# Patient Record
Sex: Male | Born: 2000 | Race: White | Hispanic: No | Marital: Single
Health system: Midwestern US, Community
[De-identification: ages and names within clinical notes are randomized; demographics above are authoritative.]

## PROBLEM LIST (undated history)

## (undated) DIAGNOSIS — J301 Allergic rhinitis due to pollen: Secondary | ICD-10-CM

## (undated) DIAGNOSIS — S62609A Fracture of unspecified phalanx of unspecified finger, initial encounter for closed fracture: Secondary | ICD-10-CM

## (undated) DIAGNOSIS — J302 Other seasonal allergic rhinitis: Secondary | ICD-10-CM

## (undated) HISTORY — DX: Fracture of unspecified phalanx of unspecified finger, initial encounter for closed fracture: S62.609A

## (undated) HISTORY — DX: Allergic rhinitis due to pollen: J30.1

---
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
## 2022-02-07 ENCOUNTER — Inpatient Hospital Stay
Admit: 2022-02-07 | Discharge: 2022-02-07 | Disposition: A | Discharge status: Home / Self Care | Payer: PRIVATE HEALTH INSURANCE

## 2022-02-07 DIAGNOSIS — K921 Melena: Secondary | ICD-10-CM

## 2022-02-07 NOTE — Discharge Instructions (Addendum)
Eat a few servings of vegetables every day, drink lots of water, avoid processed food.   Stretch and meditate daily, workout at least 3 times a week, rest and think positively.

## 2022-02-07 NOTE — ED Triage Notes (Signed)
Patient ambulated to room with complaint of black stool for past 1.5 weeks and states today he had red blood in stool and on tissue. Denies pain, denies constipation or diarrhea.

## 2022-02-07 NOTE — ED Provider Notes (Signed)
[facility]  Urgent Care Encounter      CHIEF COMPLAINT       Chief Complaint   Patient presents with    Other     Blood in stool and on tissue, black stool       Nurses Notes reviewed and I agree except as noted in the HPI.  HISTORY OFPRESENT ILLNESS   Edward Charles is a 21 y.o.  The history is provided by the patient. No language interpreter was used.   Rectal Bleeding  Quality:  Black and tarry and bright red (not tarry)  Amount:  Moderate  Duration:  2 weeks  Timing:  Intermittent  Chronicity:  New  Context: constipation, diarrhea and spontaneously    Context: not anal fissures, not anal penetration, not defecation, not foreign body, not hemorrhoids, not rectal injury and not rectal pain    Similar prior episodes: no    Relieved by:  Nothing  Worsened by:  Defecation and wiping  Ineffective treatments:  None tried  Associated symptoms: no abdominal pain, no dizziness, no epistaxis, no fever, no hematemesis, no light-headedness, no loss of consciousness, no recent illness and no vomiting    Risk factors: no anticoagulant use, no hx of colorectal cancer, no hx of colorectal surgery, no hx of IBD, no liver disease, no NSAID use and no steroid use      REVIEW OF SYSTEMS     Review of Systems   Constitutional:  Negative for activity change, appetite change, chills, diaphoresis, fatigue and fever.   HENT:  Negative for nosebleeds.    Respiratory:  Negative for apnea, cough, choking, chest tightness, shortness of breath, wheezing and stridor.    Cardiovascular:  Negative for chest pain, palpitations and leg swelling.   Gastrointestinal:  Positive for anal bleeding, constipation, diarrhea and hematochezia. Negative for abdominal distention, abdominal pain, blood in stool, hematemesis, nausea, rectal pain and vomiting.   Neurological:  Negative for dizziness, loss of consciousness and light-headedness.     PAST MEDICAL HISTORY   History reviewed. No pertinent past medical history.    SURGICAL  HISTORY     Patient  has no past surgical history on file.    CURRENT MEDICATIONS       Discharge Medication List as of 02/07/2022  3:53 PM        CONTINUE these medications which have NOT CHANGED    Details   cetirizine (ZYRTEC) 10 MG tablet Take 1 tablet by mouth dailyHistorical Med             ALLERGIES     Patient is has No Known Allergies.    FAMILY HISTORY     Patient's family history is not on file.    SOCIAL HISTORY     Patient  reports that he has never smoked. He has never used smokeless tobacco. He reports that he does not currently use alcohol. He reports that he does not use drugs.    PHYSICAL EXAM     ED TRIAGE VITALS  BP: 118/73, Temp: 98.1 F (36.7 C), Pulse: 76, Respirations: 16, SpO2: 98 %  Physical Exam  Vitals and nursing note reviewed.   Constitutional:       General: He is awake. He is not in acute distress.     Appearance: Normal appearance. He is well-developed and well-groomed. He is not ill-appearing, toxic-appearing or diaphoretic.   HENT:      Head: Normocephalic and atraumatic.  Right Ear: External ear normal.      Left Ear: External ear normal.   Eyes:      Extraocular Movements: Extraocular movements intact.      Conjunctiva/sclera: Conjunctivae normal.   Pulmonary:      Effort: Pulmonary effort is normal.   Musculoskeletal:         General: Normal range of motion.      Cervical back: Normal range of motion.   Skin:     General: Skin is warm.   Neurological:      General: No focal deficit present.      Mental Status: He is alert and oriented to person, place, and time.   Psychiatric:         Mood and Affect: Mood normal.         Behavior: Behavior normal. Behavior is cooperative.         Thought Content: Thought content normal.         Judgment: Judgment normal.       DIAGNOSTIC RESULTS   Labs:No results found for this visit on 02/07/22.    IMAGING:  No orders to display     URGENT CARE COURSE:     Vitals:    02/07/22 1520   BP: 118/73   Pulse: 76   Resp: 16   Temp: 98.1 F (36.7  C)   TempSrc: Temporal   SpO2: 98%   Weight: 180 lb (81.6 kg)   Height: 5\' 6"  (1.676 m)       Medications - No data to display  PROCEDURES:  None  FINAL IMPRESSION      1. Hematochezia        DISPOSITION/PLAN   Decision To Discharge    I estimate there is LOW risk for ACUTE APPENDICITIS, BOWEL OBSTRUCTION, CHOLECYSTITIS, DIVERTICULITIS, INCARCERATED HERNIA, PANCREATITIS, PELVIC INFLAMMATORY DISEASE, PERFORATED BOWEL or ULCER, ECTOPIC PREGNANCY, or TUBO-OVARIAN ABSCESS, thus I consider the discharge disposition reasonable. Also, there is no evidence or peritonitis, sepsis, or toxicity. The patient and/or family and I have discussed the diagnosis and risks, and we agree with discharging home to follow-up with their primary doctor. We also discussed returning to the Emergency Department immediately if new or worsening symptoms occur. We have discussed the symptoms which are most concerning (e.g., severe bleeding, fever, changing or worsening pain, vomiting) that necessitate immediate return.        PATIENT REFERRED TO:  , MD  [address]  [place]  [phone]    Schedule an appointment as soon as possible for a visit       [facility]  [address]  [address]  [place]  [phone]  Call today  As needed    DISCHARGE MEDICATIONS:  Discharge Medication List as of 02/07/2022  3:53 PM        Discharge Medication List as of 02/07/2022  3:53 PM          02/09/2022, APRN - CNP          Irven Shelling, APRN - CNP  02/07/22 [phone]

## 2022-02-12 ENCOUNTER — Ambulatory Visit: Admit: 2022-02-12 | Discharge: 2022-02-12 | Payer: PRIVATE HEALTH INSURANCE | Attending: Nurse Practitioner

## 2022-02-12 ENCOUNTER — Inpatient Hospital Stay: Payer: PRIVATE HEALTH INSURANCE

## 2022-02-12 DIAGNOSIS — Z8719 Personal history of other diseases of the digestive system: Secondary | ICD-10-CM

## 2022-02-12 LAB — CBC
Hematocrit: 45.4 % (ref 42.0–52.0)
Hemoglobin: 14.4 gm/dl (ref 14.0–18.0)
MCH: 28.3 pg (ref 26.0–33.0)
MCHC: 31.7 gm/dl — ABNORMAL LOW (ref 32.2–35.5)
MCV: 89.2 fL (ref 80.0–94.0)
MPV: 10.4 fL (ref 9.4–12.4)
Platelets: 224 10*3/uL (ref 130–400)
RBC: 5.09 10*6/uL (ref 4.70–6.10)
RDW-CV: 13.9 % (ref 11.5–14.5)
RDW-SD: 45.2 fL — ABNORMAL HIGH (ref 35.0–45.0)
WBC: 4.1 10*3/uL — ABNORMAL LOW (ref 4.8–10.8)

## 2022-02-12 MED ORDER — POLYETHYLENE GLYCOL 3350 17 GM/SCOOP PO POWD
17 GM/SCOOP | ORAL | 0 refills | Status: DC
Start: 2022-02-12 — End: 2022-03-17

## 2022-02-12 MED ORDER — PANTOPRAZOLE SODIUM 40 MG PO TBEC
40 MG | ORAL_TABLET | Freq: Every day | ORAL | 5 refills | Status: AC
Start: 2022-02-12 — End: ?

## 2022-02-12 MED ORDER — BISACODYL 5 MG PO TBEC
5 MG | ORAL_TABLET | ORAL | 0 refills | Status: AC
Start: 2022-02-12 — End: 2022-03-17

## 2022-02-12 NOTE — Progress Notes (Unsigned)
Chief Complaint:   Chief Complaint   Patient presents with    New Patient     Referred by [facility] for blood in the stool. Pt states he first noticed blood in his stool about 2 weeks ago.  Pt states he has had melena. Pt states he has seen blood in his stool and on the tissue when he wipes after a BM.  Pt states he has loose stools at times. Pt states he has experienced rectal discomfort, achy. Pt states he has acid reflux at times depending on what he eats.  Pt denies any other Gi issues at this time.        History of present Illness: Edward Charles is a 21 y.o. male     Past Medical History:  has no past medical history on file.    Past Surgical History: History reviewed. No pertinent surgical history.    Social History:   Jacoby Tartaglia  reports that he has never smoked. He has never used smokeless tobacco. He  reports current alcohol use. He  reports no history of drug use.     Family History: family history includes Breast Cancer in his maternal grandmother and mother; Diabetes in his mother; No Known Problems in his father.    Review of Systems:   -History obtained from chart review and the patient  General ROS: positive for  - fatigue  negative for - chills, fever, hot flashes, malaise, night sweats, sleep disturbance, weight gain, or weight loss  Psychological ROS: negative  ENT ROS: negative  Allergy and Immunology ROS: positive for - seasonal allergies  Hematological and Lymphatic ROS: positive for - fatigue  negative for - bleeding problems, blood clots, blood transfusions, bruising, jaundice, night sweats, pallor, swollen lymph nodes, or weight loss  Endocrine ROS: negative  Respiratory ROS: negative  Cardiovascular ROS: positive for - dyspnea on exertion and palpitations  negative for - chest pain, edema, irregular heartbeat, murmur, orthopnea, rapid heart rate, or shortness of breath  Gastrointestinal ROS: positive for - abdominal pain, blood in stools, change in bowel habits, diarrhea, heartburn,  and melena  negative for - appetite loss, change in stools, constipation, gas/bloating, hematemesis, nausea/vomiting, stool incontinence, or swallowing difficulty/pain  Genito-Urinary ROS: negative  Musculoskeletal ROS: negative  Neurological ROS: negative  Dermatological ROS: negative    Allergies: Patient has no known allergies.    Current Meds:  Current Outpatient Medications:     bisacodyl (DULCOLAX) 5 MG EC tablet, See Prep Instructions, Disp: 4 tablet, Rfl: 0    polyethylene glycol (GLYCOLAX) 17 GM/SCOOP powder, Dispense 238 Gram Bottle.  Use as Directed, Disp: 238 g, Rfl: 0    pantoprazole (PROTONIX) 40 MG tablet, Take 1 tablet by mouth every morning (before breakfast), Disp: 30 tablet, Rfl: 5    cetirizine (ZYRTEC) 10 MG tablet, Take 1 tablet by mouth daily, Disp: , Rfl:     Vital Signs:  Vitals:    02/12/22 0842   BP: 126/77   Pulse: 83   Resp: 20   Temp: 97.2 F (36.2 C)       Height:   5\' 6"  (1.673m)    Weight:  Wt Readings from Last 3 Encounters:   02/12/22 189 lb (85.7 kg) (88 %, Z= 1.16)*   02/07/22 180 lb (81.6 kg) (82 %, Z= 0.90)*     * Growth percentiles are based on CDC (Boys, 2-20 Years) data.       BMI:  30.51 kg/m2  PHYSICAL EXAM:  General Appearance:  awake, alert, oriented, in no acute distress and well developed, well nourished Caucasian male    Male Rectal:  Rectal exam deferred.    HEENT: Atraumatic, Pupils reactive, No pallor/icterus.  Oral mucosa moist/No thrush    Neck: No thyroid enlargement, No cervical or supraclavicular lymphadenopathy    Cardiovascular: Regular rate and rhythm, No murmurs. No rubs or gallops    Respiratory: Good b/l air entry, Clear to auscultation b/l    Abdomen: soft, non-tender, non-distended, no visible veins, scars, No hepatosplenomegaly or palpable masses, bowel sounds active.    Extremities: No clubbing, cyanosis, edema    Central Nervous System: alert, oriented, no gross focal motor deficits    Labs:   No results found for: WBC, HGB, HCT, MCV, PLT  No  results found for: NA, K, CL, CO2, BUN, CREATININE, GLUCOSE, CALCIUM  No results found for: ALKPHOS, ALT, AST, PROT, BILITOT, BILIDIR, LABALBU  No results found for: LACTA  No results found for: AMYLASE  No results found for: LIPASE  No results found for: INR    Assessment and Plan:     Diagnosis Orders   1. History of melena  CBC    ESOPHAGOSCOPY / EGD    pantoprazole (PROTONIX) 40 MG tablet      2. Gastroesophageal reflux disease, unspecified whether esophagitis present  ESOPHAGOSCOPY / EGD    pantoprazole (PROTONIX) 40 MG tablet      3. Encounter for diagnostic colonoscopy due to change in bowel habits  bisacodyl (DULCOLAX) 5 MG EC tablet    polyethylene glycol (GLYCOLAX) 17 GM/SCOOP powder    COLONOSCOPY W/ OR W/O BIOPSY      4. Loose stools  bisacodyl (DULCOLAX) 5 MG EC tablet    polyethylene glycol (GLYCOLAX) 17 GM/SCOOP powder    COLONOSCOPY W/ OR W/O BIOPSY    ESOPHAGOSCOPY / EGD      5. Left sided abdominal pain of unknown cause  bisacodyl (DULCOLAX) 5 MG EC tablet    polyethylene glycol (GLYCOLAX) 17 GM/SCOOP powder    COLONOSCOPY W/ OR W/O BIOPSY    ESOPHAGOSCOPY / EGD      6. History of rectal bleeding  CBC    bisacodyl (DULCOLAX) 5 MG EC tablet    polyethylene glycol (GLYCOLAX) 17 GM/SCOOP powder    COLONOSCOPY W/ OR W/O BIOPSY      7. Fatigue, unspecified type  CBC    bisacodyl (DULCOLAX) 5 MG EC tablet    polyethylene glycol (GLYCOLAX) 17 GM/SCOOP powder    COLONOSCOPY W/ OR W/O BIOPSY    ESOPHAGOSCOPY / EGD      8. Dyspnea on exertion  CBC    bisacodyl (DULCOLAX) 5 MG EC tablet    polyethylene glycol (GLYCOLAX) 17 GM/SCOOP powder    COLONOSCOPY W/ OR W/O BIOPSY    ESOPHAGOSCOPY / EGD            Thank You Dr. Tillman Abide, MD for allowing me to participate in the care of this patient.    Assessment and POC were discussed with Dr Caron Presume.    Time In Room:  0855  Time Out Room:  0911  Total Dictation Time:  *** min (including chart review)    Gladys Damme, APRN - CNP  02/12/2022  9:26 AM

## 2022-02-25 ENCOUNTER — Encounter: Payer: PRIVATE HEALTH INSURANCE | Attending: Gastroenterology

## 2022-02-25 DIAGNOSIS — Z419 Encounter for procedure for purposes other than remedying health state, unspecified: Secondary | ICD-10-CM

## 2022-02-25 LAB — HM COLONOSCOPY

## 2022-02-25 MED ORDER — OMEPRAZOLE 20 MG PO CPDR
20 MG | ORAL_CAPSULE | Freq: Every day | ORAL | 0 refills | Status: DC
Start: 2022-02-25 — End: 2022-03-17

## 2022-02-25 NOTE — H&P (Incomplete)
[facility] [place], [place]      Patient: Edward Charles Dob: [birth year]  Provider Performing Procedure: Twanna Hy, MD  Primary Care Physician: Karie Schwalbe, MD  Visit Date:  02/25/2022    PRE-PROCEDURE   Full CODE [x] Yes  DNR-CCA/DNR-CC [] Yes       Allergies:    No Known Allergies    Intolerance and Allergy to Anesthetic Agents:  Yes   []     No   []     ID Band:  Yes   []     No   []   Allergy Band:   Yes   []     No   []     Procedure:  _______________________    Patient stated reason for procedure:  ______________________________________    Informed Consent:  Yes   []     No   []     Advance Directives:   Yes   []     No   []     Past Medical History:  No past medical history on file.    Past Surgical History:  No past surgical history on file.    Bleeding Disorder:  Yes   []     No   []     If male childbearing age, date of LMP:  __________   *If more than 30 days, do urine pregnancy test pre-procedure and note result.    Inform MD if result postiive.      Patient accompanied by:  ___________    Relationship to Patient:   ______________    Valuables:   Kept with patient:    []    Kept with family or responsible person:    []    Locked up:    []     NPO since:    /   /          Clear Liquids x 1 Day:   Yes   []     No   []     Time Last Meal Eaten:  _________    What Bowel Prep Taken:  ___________    Results of Bowel Prep:  __________              RN Physical Exam  Vital Signs:   Temp (Tympanic):  ______   P:  ______   R:  ______  BP:  _______  SPO2: _______  Height:  _______    Weight:  ________    Any Pain Present:  Yes   []     No   []        If yes, then rate on scale from 1 (slight) - 10 (severe):  ________    Measures taken to relieve pain if applicable:  ________________     Color:  Normal   []     Pale   []     Other:  ________    Skin:  Warm  []     Cool  []     Dry  []     Diaphoretic  []     Heart:  Regular  []     Irregular  []   Strong  []    Faint  []    Pacemaker []    AICD  []      Lungs:  Clear   []      Abnormal:  _________   Cough:  Yes  []     No  []        Productive  []     Non-Productive []       Sputum Color:  _________  Tobacco Use:  Yes   []     No   []   If yes, ______ packs per day.      Abdomen:  Soft  []     Firm  []     Flat  []     Distended []        Bowel Sounds Present:  Yes   []     No   []     Mental Status:   Alert/Oriented []     Calm []      Anxious []      Disoriented []    Drowsy []     Glasses or Contacts:  Yes   []     No   []     Dentures:  Yes   []     No   []      Removed?  Yes   []     No   []     Hearing Aids:  Yes   []     No   []        IV Information     IV Start:    Site _________   18G []    20G []    22G []    24G []    Solution:  __________    Pre-Procedure Medication and/or IV Antibiotics:  Yes   []     No   []    If yes, list any given with time/ RN Sign.     _______________________________                     Current Medications    *Include any Herbal or Over the Counter Medications.     Anticoagulation Use:    Coumadin []     ASA []    Plavix []    Pradaxa []    Eliquis []    Brilinta []    Effient []     Date of Last Dose:  __________      Home Medication List:  Prior to Admission medications    Medication Sig Start Date End Date Taking? Authorizing Provider   bisacodyl (DULCOLAX) 5 MG EC tablet See Prep Instructions 02/12/22   , APRN - CNP   polyethylene glycol (GLYCOLAX) 17 GM/SCOOP powder Dispense 238 Gram Bottle.  Use as Directed 02/12/22   , APRN - CNP   pantoprazole (PROTONIX) 40 MG tablet Take 1 tablet by mouth every morning (before breakfast) 02/12/22   , APRN - CNP   cetirizine (ZYRTEC) 10 MG tablet Take 1 tablet by mouth daily    Historical Provider, MD       RN Signature:  __________________    Physician Signature:  ___________________

## 2022-02-25 NOTE — H&P (Signed)
[facility]  Brief Post OP note          PLANNED PROCEDURE: GI Planned Procedure: EGD and Colonscopy      ASA Classification: Class 2 - A normal healthy patient with mild systemic disease                                                                                   Pre-Procedure Check list:      Pre-procedure diagnostic studies complete and results available   Previous sedation/anesthesia experiences assessed   The patient is an appropriate candidate to undergo the planned procedure, sedation and anesthesia   Formulation and discussion of sedation/procedure plan and risks with patient and/or responsible adult     Patient immediately assessed prior to procedure  Airway assessed-Patient able to hyperextend neck  No obvious loose teeth or visible obstructions                                                                        Procedure Performed: EGD with biopsy and colonoscopy with biopsy      Medications:   [] VERSED:      [] FENTANYL:          [x] PROPOFOL:       COMPLICATIONS:  NONE                Estimated Blood Loss : NONE      FINDINGS GERD, gastritis, duodenitis and history of diarrhea biopsy obtained ileum ascending transverse descending sigmoid            RECOMMENDATIONS /DISCHARGE INSTRUCTIONS: Follow-up with          PHYSICIAN SIGNATURE:  Electronically signed by , MD on 02/25/22 at 2:10 PM EDT

## 2022-02-27 NOTE — Op Note (Signed)
ESOPHAGOGASTRODUODENOSCOPY/COLONOSCOPY RECORD    RE:  Edward Charles, Edward Charles      DATE:  02/25/2022  CASE:  [id number]       DOB:  2000-10-28  PHYSICIAN:  Twanna Hy, M.D.    PROCEDURE:  Esophagogastroduodenoscopy with biopsy and colonoscopy with biopsy.    INSTRUMENTS:  Standard video upper scope and standard video colonoscope.    MEDICATIONS:  Propofol total IV.    PHOTOGRAPHS:  Yes.    BIOPSIES:  Yes.      INDICATIONS:  The patient with a history of melena, abdominal discomfort, change in bowel habits, diarrhea, loose stools, left-sided abdominal discomfort, fatigue, and lower GI bleed.  We planned today for EGD and colonoscopy to evaluate.    ASA CLASSIFICATION:  II.    ESTIMATED BLOOD LOSS:  None.    PROCEDURE:  The patient was brought to the GI Lab.  Consent was obtained.  Risks involved with the procedure were explained to the patient.  Informed consent was obtained.    The patient was monitored during the procedure with pulse oximetry and blood pressure monitoring.  Oxygen was given by nasal cannula.    Sedation was done by incremental doses of IV propofol given by the anesthesia service to achieve monitored anesthesia care.  For ASA Classification, medication given during the procedure, please see anesthesia notes.          PROCEDURE #1:  EGD with biopsy.    Standard video upper scope was advanced under direct vision from the oral cavity up to the duodenum.  The esophagus showed features of mild acid reflux consistent with chronic acid reflux as well as features suggest eosinophilic esophagitis, biopsy obtained in jar #3.  The scope was advanced to the stomach.  Seen erosive gastritis in the antrum, biopsy obtained to evaluate.  Duodenum showed mild duodenitis, biopsy obtained to evaluate as well as history of diarrhea.  The scope was withdrawn with no immediate complications.    IMPRESSION:    Duodenal biopsy due to history of diarrhea as well as duodenitis.  Erosive gastritis in the antrum, biopsy obtained to  evaluate.  Distal esophagus, biopsy obtained due to history of GERD as well as rule out eosinophilic esophagitis.    PLAN:    Follow up on the biopsy results and return to the GI Clinic for evaluation.  More recommendations after reviewing the biopsy results.  Patient should take PPI as a first option of therapy.    PROCEDURE #2:  Colonoscopy with biopsy.    Digital examination revealed normal rectum.  Standard video colonoscope was advanced under direct vision from the rectum up to the cecum.  Prep was good and the patient tolerated the procedure well.  Cecum intubation was confirmed by seeing the appendiceal orifice.  The scope was withdrawn.  No polyps, no masses, no diverticulitis, no colitis.  Everything appeared to be normal.  Due to history of diarrhea, random biopsy obtained from the terminal ileum in one jar, ascending transverse in second jar, and descending sigmoid in third jar and the scope was withdrawn.  The scope was withdrawn with no immediate complications.    IMPRESSION:    1. Normal colonoscopy, biopsy obtained from different locations including terminal ileum.        PLAN:    Follow up on the biopsy results and return to the GI Clinic for evaluation.  More recommendations after reviewing the biopsy results.        Twanna Hy, M.D.    [id number]  CC:  Tillman Abide, M.D.

## 2022-03-17 ENCOUNTER — Inpatient Hospital Stay: Payer: PRIVATE HEALTH INSURANCE

## 2022-03-17 ENCOUNTER — Ambulatory Visit: Admit: 2022-03-17 | Discharge: 2022-03-17 | Payer: PRIVATE HEALTH INSURANCE | Attending: Nurse Practitioner

## 2022-03-17 DIAGNOSIS — K200 Eosinophilic esophagitis: Secondary | ICD-10-CM

## 2022-03-17 DIAGNOSIS — Z9889 Other specified postprocedural states: Secondary | ICD-10-CM

## 2022-03-17 MED ORDER — FLUTICASONE PROPIONATE HFA 220 MCG/ACT IN AERO
220 | RESPIRATORY_TRACT | 11 refills | Status: AC
Start: 2022-03-17 — End: ?

## 2022-03-17 NOTE — Progress Notes (Signed)
Chief Complaint:   Chief Complaint   Patient presents with    Follow Up After Procedure     Pt here for F/U EGD/COLON done at [facility]/CBC result.  Pt states since procedures he has been doing well and denies all GI symptoms at this time.       History of present Illness:   Edward Charles is a 21 year old male, known to the practice, who presents for follow-up after EGD and colonoscopy.  Patient had EGD and colonoscopy with Dr. Caron Presume on 02/25/2022 for complaints of melena, abdominal discomfort, change in bowel habits with diarrhea, left-sided abdominal pain, and lower GI bleed.  The findings of the EGD and colonoscopy as well as pathology reports were reviewed with the patient-please see results below.  The patient states today in the office that he is having no further abdominal pain.  He denies any nausea, vomiting, or GERD symptoms.  He denies dysphagia, odynophagia, early satiety, or weight loss.  He states he is no longer having diarrhea.  He denies constipation.  Denies melena or hematochezia.  Patient states he is not taking his pantoprazole.    Past Medical History:  has no past medical history on file.    Past Surgical History:   Past Surgical History:   Procedure Laterality Date    COLONOSCOPY W/ BIOPSIES  02/25/2022    Dr.Taja/[facility]    UPPER GASTROINTESTINAL ENDOSCOPY  02/25/2022    Dr. Molli Knock       Social History:  reports that he has never smoked. He has never used smokeless tobacco. He reports current alcohol use. He reports that he does not use drugs.    Family History: family history includes Breast Cancer in his maternal grandmother and mother; Diabetes in his mother; No Known Problems in his father.    Review of Systems:   -History obtained from chart review and the patient  General ROS: negative  Respiratory ROS: negative  Cardiovascular ROS: negative  Gastrointestinal ROS: negative    Allergies: Patient has no known allergies.    Current Meds:  Current Outpatient Medications:     fluticasone (FLOVENT  HFA) 220 MCG/ACT inhaler, Instill 1 puff into mouth twice daily and swallow.  Do not eat or drink for 30 minutes after administration.  DO NOT INHALE INTO LUNGS., Disp: 1 each, Rfl: 11    pantoprazole (PROTONIX) 40 MG tablet, Take 1 tablet by mouth every morning (before breakfast) (Patient not taking: Reported on 03/17/2022), Disp: 30 tablet, Rfl: 5    cetirizine (ZYRTEC) 10 MG tablet, Take 1 tablet by mouth daily (Patient not taking: Reported on 03/17/2022), Disp: , Rfl:     Vital Signs:  Vitals:    03/17/22 1014   BP: 100/60   Pulse: 69   Resp: 18   Temp: 98.2 F (36.8 C)       Height:  5\' 6"  (1.686m)    Weight:  Wt Readings from Last 3 Encounters:   03/17/22 188 lb 12.8 oz (85.6 kg) (87 %, Z= 1.14)*   02/12/22 189 lb (85.7 kg) (88 %, Z= 1.16)*   02/07/22 180 lb (81.6 kg) (82 %, Z= 0.90)*     * Growth percentiles are based on CDC (Boys, 2-20 Years) data.       BMI:  30.47 kg/m2    Physical Exam:  General Appearance:  awake, alert, oriented, in no acute distress and well developed, well nourished Caucasian male  HEENT: Atraumatic, Pupils reactive, No pallor/icterus.  Oral mucosa moist/No thrush  Neck: No thyroid enlargement, No cervical or supraclavicular lymphadenopathy  CVS: Regular rate and rhythm, No murmurs. No rubs or gallops  RS: Good b/l air entry, Clear to auscultation b/l  Abd: soft, non-tender, non-distended, no visible veins, scars, No hepatosplenomegaly or palpable masses, bowel sounds active.  Ext: No clubbing, cyanosis, edema  CNS: alert, oriented, no gross focal motor deficits    Labs:   Lab Results   Component Value Date/Time    WBC 4.1 02/12/2022 10:14 AM    HGB 14.4 02/12/2022 10:14 AM    HCT 45.4 02/12/2022 10:14 AM    MCV 89.2 02/12/2022 10:14 AM    PLT 224 02/12/2022 10:14 AM     No results found for: "NA", "K", "CL", "CO2", "BUN", "CREATININE", "GLUCOSE", "CALCIUM"  No results found for: "ALKPHOS", "ALT", "AST", "PROT", "BILITOT", "BILIDIR", "LABALBU"  No results found for: "LACTA"  No results  found for: "AMYLASE"  No results found for: "LIPASE"  No results found for: "INR"    Pathology:         Endoscopy Report:  REElpidio Thielen, Aemon                                                                        DATE:  02/25/2022  CASE:  [id number]                                                                                   DOB:  11/18/2000  PHYSICIAN:  Emelia Loron, M.D.     PROCEDURE:  Esophagogastroduodenoscopy with biopsy and colonoscopy with biopsy.     INSTRUMENTS:  Standard video upper scope and standard video colonoscope.     MEDICATIONS:  Propofol total IV.     PHOTOGRAPHS:  Yes.     BIOPSIES:  Yes.     INDICATIONS:  The patient with a history of melena, abdominal discomfort, change in bowel habits, diarrhea, loose stools, left-sided abdominal discomfort, fatigue, and lower GI bleed.  We planned today for EGD and colonoscopy to evaluate.     ASA CLASSIFICATION:  II.     ESTIMATED BLOOD LOSS:  None.     PROCEDURE:  The patient was brought to the GI Lab.  Consent was obtained.  Risks involved with the procedure were explained to the patient.  Informed consent was obtained.     The patient was monitored during the procedure with pulse oximetry and blood pressure monitoring.  Oxygen was given by nasal cannula.     Sedation was done by incremental doses of IV propofol given by the anesthesia service to achieve monitored anesthesia care.  For ASA Classification, medication given during the procedure, please see anesthesia notes.      PROCEDURE #1:  EGD with biopsy.     Standard video upper scope was advanced under direct vision from the oral cavity up to the duodenum.  The esophagus showed features of mild acid reflux  consistent with chronic acid reflux as well as features suggest eosinophilic esophagitis, biopsy obtained in jar #3.  The scope was advanced to the stomach.  Seen erosive gastritis in the antrum, biopsy obtained to evaluate.  Duodenum showed mild duodenitis, biopsy obtained to evaluate as well as  history of diarrhea.  The scope was withdrawn with no immediate complications.     IMPRESSION:     Duodenal biopsy due to history of diarrhea as well as duodenitis.  Erosive gastritis in the antrum, biopsy obtained to evaluate.  Distal esophagus, biopsy obtained due to history of GERD as well as rule out eosinophilic esophagitis.     PLAN:     Follow up on the biopsy results and return to the GI Clinic for evaluation.  More recommendations after reviewing the biopsy results.  Patient should take PPI as a first option of therapy.     PROCEDURE #2:  Colonoscopy with biopsy.     Digital examination revealed normal rectum.  Standard video colonoscope was advanced under direct vision from the rectum up to the cecum.  Prep was good and the patient tolerated the procedure well.  Cecum intubation was confirmed by seeing the appendiceal orifice.  The scope was withdrawn.  No polyps, no masses, no diverticulitis, no colitis.  Everything appeared to be normal.  Due to history of diarrhea, random biopsy obtained from the terminal ileum in one jar, ascending transverse in second jar, and descending sigmoid in third jar and the scope was withdrawn.  The scope was withdrawn with no immediate complications.     IMPRESSION:     1.   Normal colonoscopy, biopsy obtained from different locations including terminal ileum.          PLAN:     Follow up on the biopsy results and return to the GI Clinic for evaluation.  More recommendations after reviewing the biopsy results.    Assessment and Plan:     Diagnosis Orders   1. S/P endoscopy        2. Gastroesophageal reflux disease with esophagitis without hemorrhage        3. Eosinophilic esophagitis  fluticasone (FLOVENT HFA) 220 MCG/ACT inhaler    Common Food Allergen Profile      4. Erosive gastritis        5. S/P colonoscopy          Because the patient was found to have eosinophilic esophagitis, GERD, and erosive gastritis, he was instructed to resume his pantoprazole daily as  previously prescribed.  We will start the patient on a Flovent inhaler for the findings of eosinophilic esophagitis.  He was instructed to instill the medication on his tongue and swallow it, not inhale it.  If he is unable to do this, he can instill the medication into a teaspoon of honey and swallow that.  He was instructed not to eat or drink for at least 30 to 45 minutes after administration.  Patient will have repeat EGD in one year's time to reevaluate the eosinophilic esophagitis.  He will have common food allergy testing and be reevaluated in the office in six weeks.    Thank You Dr. Karie Schwalbe, MD for allowing me to participate in the care of this patient.    Assessment and POC were discussed with Dr Caron Presume.    Time In Room:  1019  Time Out Room:  1026  Total Dictation Time:  10 min (including chart review)    Gladys Damme, APRN -  CNP  03/17/2022  10:43 AM    *I utilize [employer] dictation and transcription services for my office notes.  Please excuse any typos or grammatical errors.*

## 2022-03-21 LAB — COMMON FOOD ALLERGEN PROFILE
ALLERGEN NAVY BEAN: 0.22 kU/L (ref <=0.34)
Allergen Pepper C. Annuum IgE: 0.2 kU/L (ref <=0.34)
Allergen Potato IgE: 0.21 kU/L (ref <=0.34)
Allergen Rice IgE: 0.25 kU/L (ref <=0.34)
Allergen Rye IgE: 0.22 kU/L (ref <=0.34)
Allergen Tuna IgE: <0.1 kU/L (ref <=0.34)
Allergen, Pork: <0.1 kU/L (ref <=0.34)
Barley IgE: 0.2 kU/L (ref <=0.34)
Beef: <0.1 kU/L (ref <=0.34)
Cabbage IgE: 0.22 kU/L (ref <=0.34)
Carrot IgE: 0.2 kU/L (ref <=0.34)
Chicken IgE: <0.1 kU/L (ref <=0.34)
Codfish IgE: <0.1 kU/L (ref <=0.34)
Corn IgE: 0.21 kU/L (ref <=0.34)
Crab IgE: 0.14 kU/L (ref <=0.34)
Egg White IgE: <0.1 kU/L (ref <=0.34)
Grape IgE: 0.14 kU/L (ref <=0.34)
Lettuce IgE: 0.17 kU/L (ref <=0.34)
Milk IgE: <0.1 kU/L (ref <=0.34)
Oat: 0.4 kU/L — ABNORMAL HIGH (ref <=0.34)
Orange IgE: 0.17 kU/L (ref <=0.34)
Shrimp IgE: <0.1 kU/L (ref <=0.34)
Soybean IgE: 0.2 kU/L (ref <=0.34)
Tomato IgE: 0.24 kU/L (ref <=0.34)
Wheat IgE: 0.26 kU/L (ref <=0.34)

## 2022-04-28 ENCOUNTER — Encounter: Payer: PRIVATE HEALTH INSURANCE | Attending: Nurse Practitioner

## 2022-06-07 NOTE — Telephone Encounter (Signed)
Edward Charles called. His parents told him Flovent will not be covered as on 06/21/22 on parents new insurance.  Is there an alternative you can recommend?? If no alternative, can you write a letter of medical necessity for the Flovent??

## 2022-06-08 NOTE — Telephone Encounter (Signed)
Do we know what insurance they are switching to so we can check the formulary for alternatives?

## 2022-06-10 NOTE — Telephone Encounter (Signed)
Voice mail is full and could not leave message.

## 2023-02-09 ENCOUNTER — Telehealth: Payer: Self-pay | Admitting: Internal Medicine

## 2023-02-09 NOTE — Telephone Encounter (Signed)
Patient last seen in 2022- spoke with patient's father; patient is away at school but once completed in December he would like for his son to call and schedule physical

## 2023-03-01 ENCOUNTER — Inpatient Hospital Stay
Admit: 2023-03-01 | Discharge: 2023-03-01 | Disposition: A | Discharge status: Home / Self Care | Payer: PRIVATE HEALTH INSURANCE

## 2023-03-01 DIAGNOSIS — S39011A Strain of muscle, fascia and tendon of abdomen, initial encounter: Secondary | ICD-10-CM

## 2023-03-01 MED ORDER — IBUPROFEN 200 MG PO TABS
200 | Freq: Four times a day (QID) | ORAL | Status: AC | PRN
Start: 2023-03-01 — End: ?

## 2023-03-01 NOTE — ED Provider Notes (Signed)
 [facility]  Urgent Care Encounter       CHIEF COMPLAINT       Chief Complaint   Patient presents with    Abdominal Pain     Umbilical         Nurses Notes reviewed and I agree except as noted in the HPI.  HISTORY OF PRESENT ILLNESS   Edward Charles is a 22 y.o. male who presents with with complaints of abdominal discomfort just below his bellybutton.  This been present for the week and a half.  Admits to discomfort mainly when lifting heavy objects or rolling over in bed.  Denies any specific known injury.  Denies any nausea or vomiting.  No pain currently.    The history is provided by the patient.       REVIEW OF SYSTEMS     Review of Systems   Constitutional:  Negative for activity change and fever.   Respiratory:  Negative for shortness of breath.    Cardiovascular:  Negative for chest pain.   Gastrointestinal:  Positive for abdominal pain. Negative for abdominal distention, blood in stool, nausea and vomiting.   Genitourinary:  Negative for dysuria and testicular pain.   Neurological:  Negative for weakness.       PAST MEDICAL HISTORY   History reviewed. No pertinent past medical history.    SURGICALHISTORY     Patient  has a past surgical history that includes Upper gastrointestinal endoscopy (02/25/2022) and Colonoscopy w/ biopsies (02/25/2022).    CURRENT MEDICATIONS       Previous Medications    CETIRIZINE (ZYRTEC) 10 MG TABLET    Take 1 tablet by mouth daily    FLUTICASONE  (FLOVENT  HFA) 220 MCG/ACT INHALER    Instill 1 puff into mouth twice daily and swallow.  Do not eat or drink for 30 minutes after administration.  DO NOT INHALE INTO LUNGS.    PANTOPRAZOLE  (PROTONIX ) 40 MG TABLET    Take 1 tablet by mouth every morning (before breakfast)       ALLERGIES     Patient is has No Known Allergies.    Patients   There is no immunization history on file for this patient.    FAMILY HISTORY     Patient's family history includes Breast Cancer in his maternal grandmother and mother; Diabetes  in his mother; No Known Problems in his father.    SOCIAL HISTORY     Patient  reports that he has never smoked. He has never used smokeless tobacco. He reports current alcohol use. He reports that he does not use drugs.    PHYSICAL EXAM     ED TRIAGE VITALS  BP: 134/87, Temp: 98.7 F (37.1 C), Pulse: (!) 112, Respirations: 20, SpO2: 98 %,Estimated body mass index is 29.05 kg/m as calculated from the following:    Height as of 03/17/22: 1.676 m (5' 6).    Weight as of this encounter: 81.6 kg (180 lb).,No LMP for male patient.    Physical Exam  Vitals and nursing note reviewed.   Constitutional:       General: He is not in acute distress.     Appearance: He is well-developed.   Cardiovascular:      Rate and Rhythm: Normal rate and regular rhythm.   Pulmonary:      Effort: Pulmonary effort is normal.   Abdominal:      General: Abdomen is flat. Bowel sounds are normal. There is no distension. There  are no signs of injury.      Palpations: Abdomen is soft.      Tenderness: There is no abdominal tenderness. There is no guarding.      Hernia: There is no hernia in the umbilical area.   Skin:     General: Skin is warm and dry.   Neurological:      Mental Status: He is alert and oriented to person, place, and time.         DIAGNOSTIC RESULTS     Labs:No results found for this visit on 03/01/23.    IMAGING:  None    EKG:  None    URGENT CARE COURSE:     Vitals:    03/01/23 1330   BP: 134/87   Pulse: (!) 112   Resp: 20   Temp: 98.7 F (37.1 C)   TempSrc: Temporal   SpO2: 98%   Weight: 81.6 kg (180 lb)       Medications - No data to display       PROCEDURES:  None    FINAL IMPRESSION      1. Abdominal muscle strain, initial encounter      DISPOSITION/ PLAN   DISPOSITION Decision To Discharge 03/01/2023 02:21:23 PM     Exam consistent with an abdominal wall strain.  Recommend starting ibuprofen  and rehab exercises.  Advised if symptoms should persist greater than 2 weeks to follow-up with the family medicine residency  cl[facility].  If abdominal pain worsens and becomes severe, present immediately to the ER.    PATIENT REFERRED TO:  Jimmy Charlie FERNS, MD  [address] [place] / [place] [id number]      DISCHARGE MEDICATIONS:  New Prescriptions    IBUPROFEN  (ADVIL ) 200 MG TABLET    Take 3 tablets by mouth every 6 hours as needed for Pain       Discontinued Medications    No medications on file       Current Discharge Medication List          Redell Smalling, APRN - CNP    (Please note that portions of this note were completed with a voice recognition program. Efforts were made to edit the dictations but occasionally words are mis-transcribed.)            Smalling Redell, APRN - CNP  03/01/23 1425

## 2023-03-01 NOTE — ED Triage Notes (Signed)
 Patient ambulated to room with c/o intermittent shooting sharp abdominal pains, surrounding umbilicus, beginning one week ago. Pains occur with movement or lifting. Requests work excuse.
